# Patient Record
Sex: Male | Born: 1995 | Race: White | Hispanic: No | Marital: Single | Smoking: Never smoker
Health system: Southern US, Community
[De-identification: ages and names within clinical notes are randomized; demographics above are authoritative.]

---
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
## 2017-07-30 ENCOUNTER — Encounter: Payer: Self-pay | Admitting: Family Medicine

## 2017-07-30 ENCOUNTER — Ambulatory Visit (HOSPITAL_BASED_OUTPATIENT_CLINIC_OR_DEPARTMENT_OTHER)
Admission: RE | Admit: 2017-07-30 | Discharge: 2017-07-30 | Disposition: A | Discharge status: Home / Self Care | Payer: 59 | Source: Ambulatory Visit | Attending: Family Medicine | Admitting: Family Medicine

## 2017-07-30 ENCOUNTER — Ambulatory Visit: Payer: 59 | Admitting: Family Medicine

## 2017-07-30 VITALS — BP 120/80 | HR 72 | Temp 98.1°F | Ht 74.0 in | Wt 220.1 lb

## 2017-07-30 DIAGNOSIS — N509 Disorder of male genital organs, unspecified: Secondary | ICD-10-CM | POA: Diagnosis present

## 2017-07-30 DIAGNOSIS — N5089 Other specified disorders of the male genital organs: Secondary | ICD-10-CM

## 2017-07-30 DIAGNOSIS — N503 Cyst of epididymis: Secondary | ICD-10-CM | POA: Insufficient documentation

## 2017-07-30 NOTE — Progress Notes (Signed)
Pre visit review using our clinic review tool, if applicable. No additional management support is needed unless otherwise documented below in the visit note. 

## 2017-07-30 NOTE — Patient Instructions (Signed)
Someone will call you regarding your ultrasound.   Let us know if you need anything.

## 2017-07-30 NOTE — Progress Notes (Signed)
Chief Complaint  Patient presents with  . Testicle Pain    Subjective: Patient is a 22 y.o. male here for testicular mass.  For around 1 yr, pt has noticed a hard lump on his R testicle. No pain, redness, urinary complaints. It is not growing/changing. Denies trauma. No personal/famhx of testicular issues.   ROS: GU: As noted in HPI  Family History  Problem Relation Age of Onset  . Cancer Maternal Aunt [id number]       colon  . Cancer Maternal Uncle 75       colon  . Cancer Maternal Grandmother        breast  . Cancer Paternal Grandmother        uterine   Past Medical History:  Diagnosis Date  . Asthma    No Known Allergies  Takes no meds routinely.  Objective: BP 120/80 (BP Location: Left Arm, Patient Position: Sitting, Cuff Size: Normal)   Pulse 72   Temp 98.1 F (36.7 C) (Oral)   Ht 6\' 2"  (1.88 m)   Wt 220 lb 2 oz (99.8 kg)   SpO2 97%   BMI 28.26 kg/m  General: Awake, appears stated age Lungs: No accessory muscle use GU: R testicle nontender, a small and hard nodule on the body of testicle, it is not freely moveable, no redness, excessive warmth, fluctuance Psych: Age appropriate judgment and insight, normal affect and mood  Assessment and Plan: Testicular mass - Plan: US Scrotum  Orders as above. Ck US. If concerning, will refer to urology and get beta-HCG and AFP.  F/u prn.  The patient voiced understanding and agreement to the plan.  Charles Rocheicholas Paul EldonWendling, DO 07/30/17  7:16 AM

## 2017-08-01 ENCOUNTER — Telehealth: Payer: Self-pay | Admitting: *Deleted

## 2017-08-01 NOTE — Telephone Encounter (Signed)
Copied from CRM [phone]. Topic: Quick Communication - Other Results >> Jul 31, 2017 11:35 AM Crist InfanteHarrald, Kathy J wrote: Pt would like results of ultrasound done yesterday. I also set up on mychart, so ok to send via mychart as well.

## 2017-08-01 NOTE — Telephone Encounter (Signed)
Patient viewed thru mychart. 

## 2017-12-07 ENCOUNTER — Other Ambulatory Visit: Payer: Self-pay

## 2017-12-07 ENCOUNTER — Encounter (HOSPITAL_COMMUNITY): Payer: Self-pay

## 2017-12-07 ENCOUNTER — Emergency Department (HOSPITAL_COMMUNITY)
Admission: EM | Admit: 2017-12-07 | Discharge: 2017-12-07 | Disposition: A | Discharge status: Home / Self Care | Payer: 59 | Attending: Emergency Medicine | Admitting: Emergency Medicine

## 2017-12-07 ENCOUNTER — Emergency Department (HOSPITAL_COMMUNITY): Payer: 59

## 2017-12-07 DIAGNOSIS — J45909 Unspecified asthma, uncomplicated: Secondary | ICD-10-CM | POA: Insufficient documentation

## 2017-12-07 DIAGNOSIS — R002 Palpitations: Secondary | ICD-10-CM

## 2017-12-07 DIAGNOSIS — M545 Low back pain: Secondary | ICD-10-CM | POA: Insufficient documentation

## 2017-12-07 DIAGNOSIS — M79602 Pain in left arm: Secondary | ICD-10-CM | POA: Diagnosis present

## 2017-12-07 LAB — BASIC METABOLIC PANEL
Anion gap: 9 (ref 5–15)
BUN: 13 mg/dL (ref 6–20)
CO2: 27 mmol/L (ref 22–32)
CREATININE: 1.12 mg/dL (ref 0.61–1.24)
Calcium: 9.5 mg/dL (ref 8.9–10.3)
Chloride: 107 mmol/L (ref 101–111)
GFR calc Af Amer: >60 mL/min (ref >60)
Glucose, Bld: 102 mg/dL — ABNORMAL HIGH (ref 65–99)
Potassium: 3.9 mmol/L (ref 3.5–5.1)
SODIUM: 143 mmol/L (ref 135–145)

## 2017-12-07 LAB — I-STAT TROPONIN, ED: Troponin i, poc: 0 ng/mL (ref 0.00–0.08)

## 2017-12-07 LAB — CBC
HCT: 40 % (ref 39.0–52.0)
Hemoglobin: 14.4 g/dL (ref 13.0–17.0)
MCH: 31.4 pg (ref 26.0–34.0)
MCHC: 36 g/dL (ref 30.0–36.0)
MCV: 87.1 fL (ref 78.0–100.0)
Platelets: 384 10*3/uL (ref 150–400)
RBC: 4.59 MIL/uL (ref 4.22–5.81)
RDW: 12.5 % (ref 11.5–15.5)
WBC: 7.6 10*3/uL (ref 4.0–10.5)

## 2017-12-07 LAB — D-DIMER, QUANTITATIVE (NOT AT ARMC)

## 2017-12-07 MED ORDER — IOPAMIDOL (ISOVUE-370) INJECTION 76%
100.0000 mL | Freq: Once | INTRAVENOUS | Status: AC | PRN
  Administered 2017-12-07: 100 mL via INTRAVENOUS

## 2017-12-07 NOTE — ED Provider Notes (Signed)
[facility]-EMERGENCY DEPT Provider Note   CSN: [id number] Arrival date & time: 12/07/17  1918     History   Chief Complaint Chief Complaint  Patient presents with  . Arm Pain  . Palpitations    HPI Charles Zhang is a 22 y.o. male.  Complaining of left arm pain for over a week.  He was donating platelets by apheresis for school and had multiple IV sticks over the course of the fifth through the 12th.  It sounds like over the last 1 they had difficulty accessing when he is still got some bruising over bilateral antecubital.  He states he is also been troubled by this pain pain in his left mid scapular area and a little bit of fluttering in his chest.  He does not feel short of breath though.  His father had a history of a PE due to a gene issue and the family brought him in to be evaluated to make sure he does not have a blood clot.  He denies any fever no chest pain no shortness of breath no abdominal pain nausea vomiting or diarrhea.  His pulse ox here is 100%.  The history is provided by the patient.  Arm Pain  This is a new problem. The current episode started more than 1 week ago. The problem occurs constantly. The problem has not changed since onset.Pertinent negatives include no chest pain, no abdominal pain, no headaches and no shortness of breath. The symptoms are aggravated by bending and twisting. Nothing relieves the symptoms. He has tried nothing for the symptoms. The treatment provided no relief.  Palpitations   This is a new problem. The current episode started 2 days ago. The problem occurs daily. The problem has not changed since onset.Associated symptoms include back pain. Pertinent negatives include no diaphoresis, no fever, no malaise/fatigue, no numbness, no chest pain, no chest pressure, no near-syncope, no orthopnea, no PND, no syncope, no abdominal pain, no nausea, no vomiting, no headaches, no leg pain, no lower extremity edema, no dizziness, no  weakness, no cough and no shortness of breath. He has tried nothing for the symptoms. The treatment provided no relief. There are no known risk factors.    Past Medical History:  Diagnosis Date  . Asthma     Patient Active Problem List   Diagnosis Date Noted  . Asthma 07/25/2011    No past surgical history on file.      Home Medications    Prior to Admission medications   Not on File    Family History Family History  Problem Relation Age of Onset  . Cancer Maternal Aunt [id number]       colon  . Cancer Maternal Uncle 75       colon  . Cancer Maternal Grandmother        breast  . Cancer Paternal Grandmother        uterine    Social History Social History   Tobacco Use  . Smoking status: Never Smoker  . Smokeless tobacco: Never Used  Substance Use Topics  . Alcohol use: No  . Drug use: No     Allergies   Patient has no known allergies.   Review of Systems Review of Systems  Constitutional: Negative for diaphoresis, fever and malaise/fatigue.  HENT: Negative for sore throat.   Eyes: Negative for visual disturbance.  Respiratory: Negative for cough and shortness of breath.   Cardiovascular: Positive for palpitations. Negative for chest pain, orthopnea,  syncope, PND and near-syncope.  Gastrointestinal: Negative for abdominal pain, nausea and vomiting.  Genitourinary: Negative for dysuria.  Musculoskeletal: Positive for back pain. Negative for neck pain.  Skin: Positive for wound (bruising antecub bilat). Negative for rash.  Neurological: Negative for dizziness, weakness, numbness and headaches.     Physical Exam Updated Vital Signs BP (!) 164/100 (BP Location: Right Arm)   Pulse 91   Temp 98.4 F (36.9 C) (Oral)   Resp 16   Ht 6\' 2"  (1.88 m)   Wt 99.8 kg (220 lb)   SpO2 100%   BMI 28.25 kg/m   Physical Exam  Constitutional: He appears well-developed and well-nourished.  HENT:  Head: Normocephalic and atraumatic.  Right Ear: External ear normal.    Left Ear: External ear normal.  Nose: Nose normal.  Mouth/Throat: Oropharynx is clear and moist.  Eyes: Conjunctivae are normal.  Neck: Normal range of motion. Neck supple.  Cardiovascular: Normal rate, regular rhythm, normal heart sounds and intact distal pulses.  No murmur heard. Pulmonary/Chest: Effort normal and breath sounds normal. No respiratory distress.  Abdominal: Soft. There is no tenderness.  Musculoskeletal: Normal range of motion. He exhibits no edema or deformity.  Bilateral antecubital foot some healing bruising.  Neurological: He is alert. He has normal strength. No sensory deficit. GCS eye subscore is 4. GCS verbal subscore is 5. GCS motor subscore is 6.  Skin: Skin is warm and dry.  Psychiatric: He has a normal mood and affect.  Nursing note and vitals reviewed.    ED Treatments / Results  Labs (all labs ordered are listed, but only abnormal results are displayed) Labs Reviewed  BASIC METABOLIC PANEL - Abnormal; Notable for the following components:      Result Value   Glucose, Bld 102 (*)    All other components within normal limits  CBC  D-DIMER, QUANTITATIVE (NOT AT [facility])  I-STAT TROPONIN, ED    EKG EKG Interpretation  Date/Time:  Friday December 07 2017 20:01:17 EDT Ventricular Rate:  88 PR Interval:    QRS Duration: 101 QT Interval:  344 QTC Calculation: 417 R Axis:   99 Text Interpretation:  Sinus rhythm Borderline right axis deviation Borderline T wave abnormalities no prior to compare with Confirmed by Meridee Score [phone]) on 12/07/2017 10:24:09 PM   Radiology Dg Chest 2 View  Result Date: 12/07/2017 CLINICAL DATA:  Left arm pain for 1 week.  Heart fluttering. EXAM: CHEST - 2 VIEW COMPARISON:  None. FINDINGS: Lungs clear. Heart size normal. No pneumothorax or pleural fluid. No bony abnormality. IMPRESSION: Normal chest. Electronically Signed   By: Drusilla Kanner M.D.   On: 12/07/2017 20:45   Ct Angio Chest Pe W/cm &/or Wo Cm  Result  Date: 12/07/2017 CLINICAL DATA:  LEFT arm pain, fluttering in chest. Recent platelet donation. Family history of pulmonary embolism. EXAM: CT ANGIOGRAPHY CHEST WITH CONTRAST TECHNIQUE: Multidetector CT imaging of the chest was performed using the standard protocol during bolus administration of intravenous contrast. Multiplanar CT image reconstructions and MIPs were obtained to evaluate the vascular anatomy. CONTRAST:  ISOVUE-370 IOPAMIDOL (ISOVUE-370) INJECTION 76% COMPARISON:  Chest radiograph December 07, 2017 FINDINGS: Mild respiratory motion degraded examination. CARDIOVASCULAR: Suboptimal contrast opacification of the pulmonary artery's (175 counseled units, target is 250 Hounsfield units. Main pulmonary artery is not enlarged. No pulmonary arterial filling defects to the level of the segmental branches. Heart size is normal, no right heart strain. No pericardial effusion. Thoracic aorta is normal course and caliber, unremarkable.  MEDIASTINUM/NODES: No lymphadenopathy by CT size criteria. LUNGS/PLEURA: Tracheobronchial tree is patent, no pneumothorax. No pleural effusions, focal consolidations, pulmonary nodules or masses. UPPER ABDOMEN: Included view of the abdomen is unremarkable. MUSCULOSKELETAL: Nonacute.  Schmorl's nodes. Review of the MIP images confirms the above findings. IMPRESSION: Negative CTA chest. Electronically Signed   By: Awilda Metro M.D.   On: 12/07/2017 23:29    Procedures Procedures (including critical care time)  Medications Ordered in ED Medications - No data to display   Initial Impression / Assessment and Plan / ED Course  I have reviewed the triage vital signs and the nursing notes.  Pertinent labs & imaging results that were available during my care of the patient were reviewed by me and considered in my medical decision making (see chart for details).  Clinical Course as of Dec 09 1035  Fri Dec 07, 2017  2321 After long discussion of the pros and cons of  d-dimer versus CAT scan the family and the patient decided that they would do the d-dimer and then CAT scan if needed.  About 20 minutes later the nurse came back to me and so the family would just want the CAT scan only because they would be satisfied with a d-dimer if it was negative.  CT was ordered.   [MB]    Clinical Course User Index [MB] Terrilee Files, MD     Final Clinical Impressions(s) / ED Diagnoses   Final diagnoses:  Heart palpitations  Arm pain, anterior, left    ED Discharge Orders    None       Terrilee Files, MD 12/08/17 1038

## 2017-12-07 NOTE — ED Triage Notes (Addendum)
Pt arrives c/o left arm pain x 1week. Pt donated platelets x1 week ago (donated single unit on 5th, 7th, 10th, 12th) Pt reports arm pain while moving, bruise noted to left AC area. Pt also reports faint pain in mid/upper left side of back as well as mild chest discomfort, states that "it feels like my heart is fluttering." Denies shortness of breath. Pt has full ROM of arm

## 2017-12-07 NOTE — Discharge Instructions (Addendum)
Your evaluated in the emergency department for some heart palpitations and arm pain in the setting of having recently done platelet donations.  There was concern for having a pulmonary embolism with your family history.  A CAT scan was done to evaluate this and showed no evidence of a blood clot.  You also had an EKG that was done that showed some abnormal T waves with no old one to compare with.  This will need follow-up with your primary care doctor.

## 2018-09-28 IMAGING — US US SCROTUM W/ DOPPLER COMPLETE
1 series · 14 of 25 positions shown · non-contrast
Comparison: None.

CLINICAL DATA: Palpable right testicular mass

EXAM:
SCROTAL ULTRASOUND
DOPPLER ULTRASOUND OF THE TESTICLES
TECHNIQUE: Complete ultrasound examination of the testicles, epididymis, and
other scrotal structures was performed. Color and spectral Doppler
ultrasound were also utilized to evaluate blood flow to the
testicles.

[Series 1: us scrotum w/ doppler complete · 0.06mm/px · 14 of 65 slices shown]
[im 1/65]
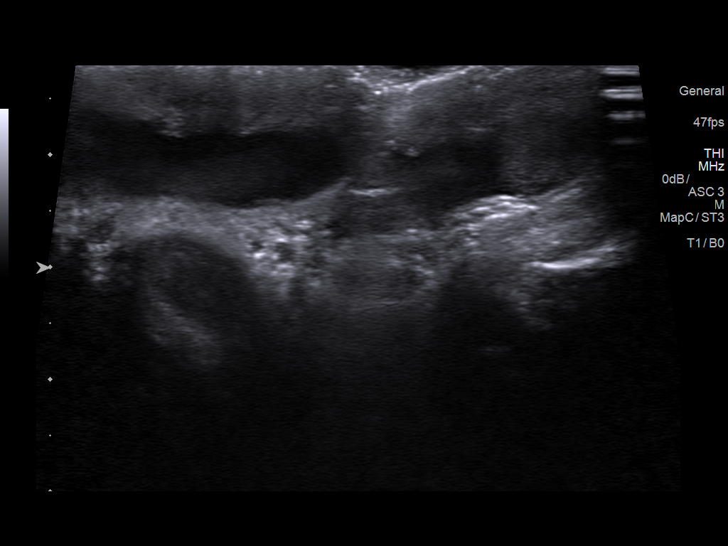
[im 6/65]
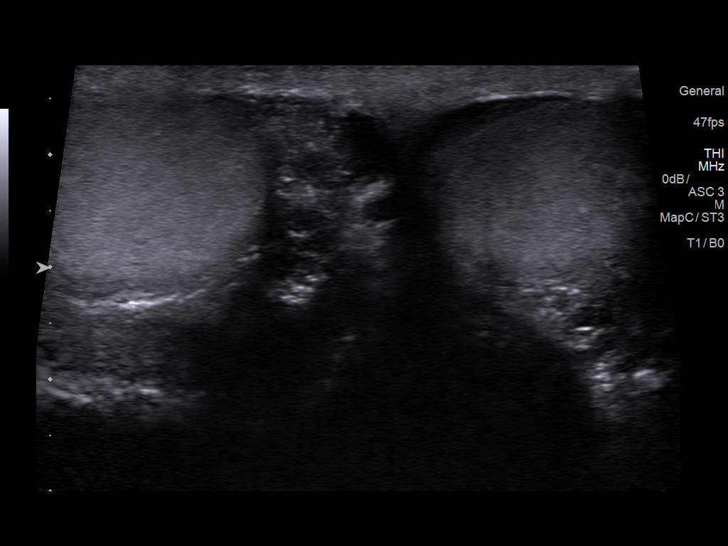
[im 11/65]
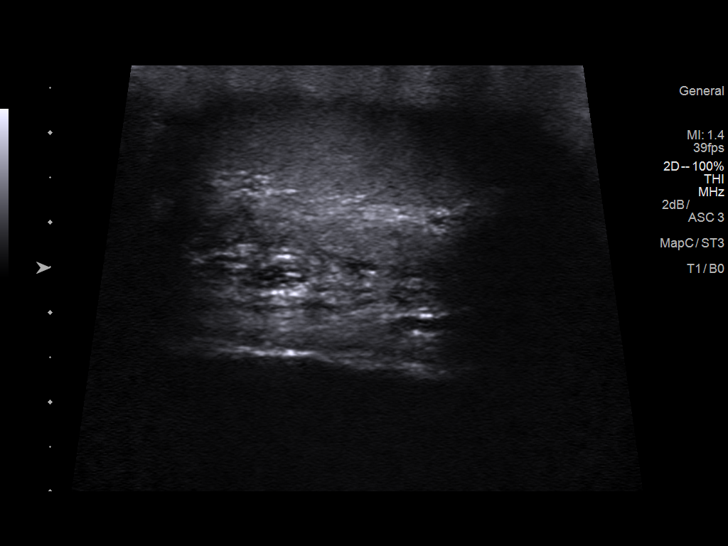
[im 17/65]
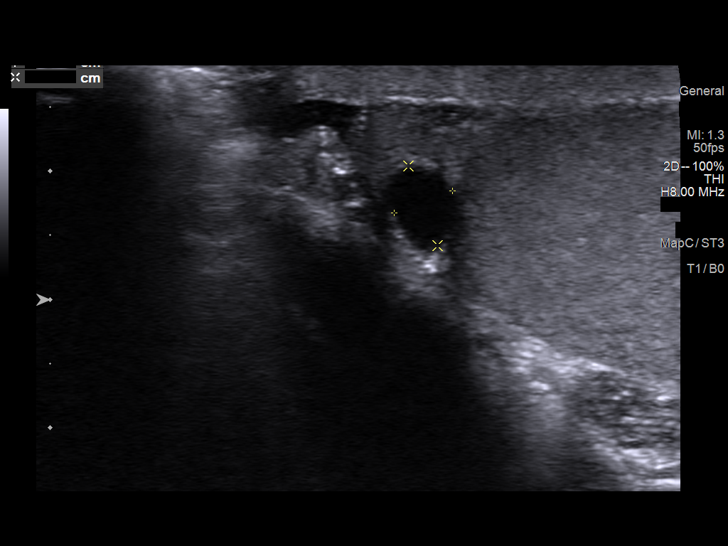
[im 22/65]
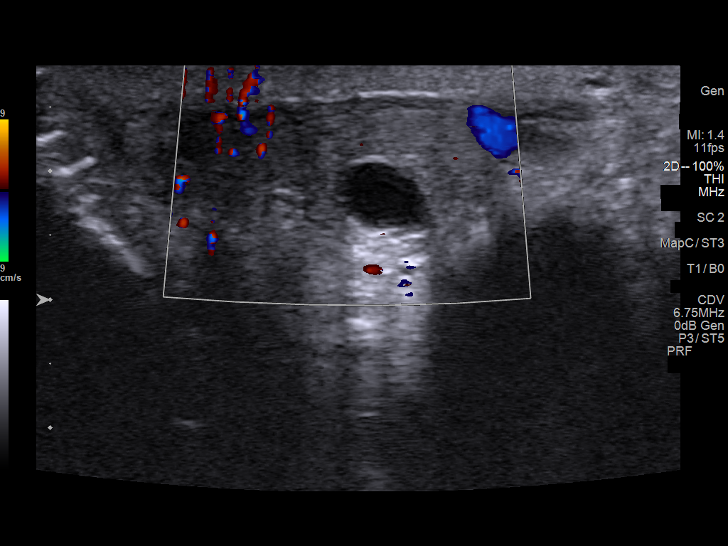
[im 25/65]
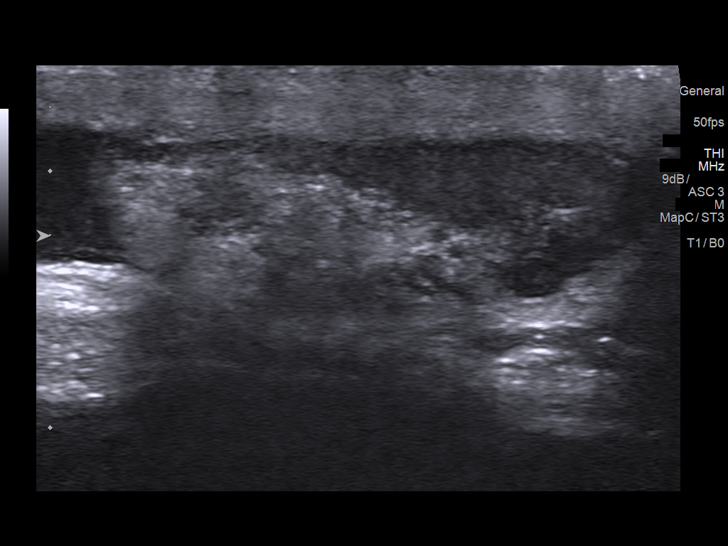
[im 30/65]
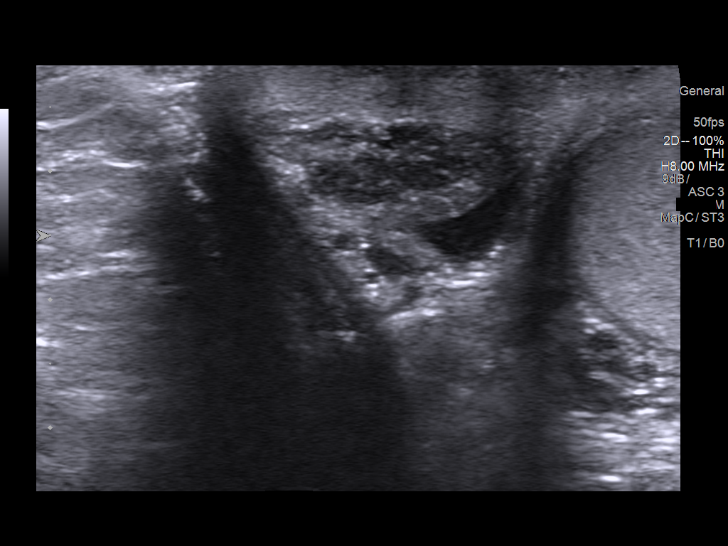
[im 35/65]
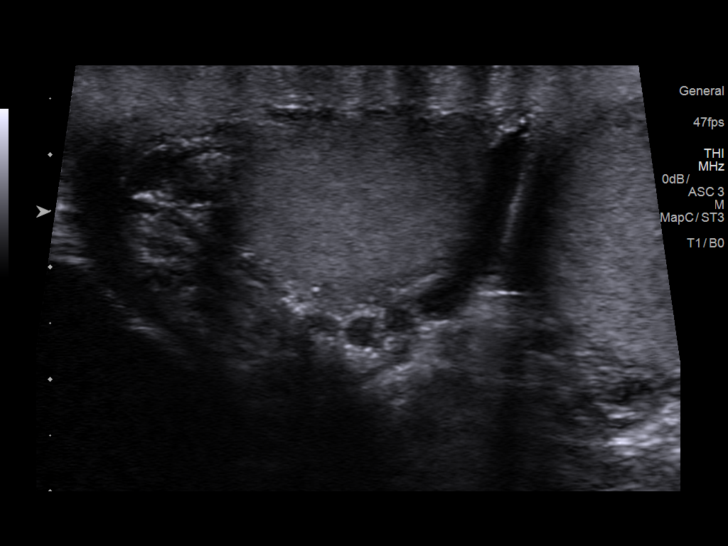
[im 41/65]
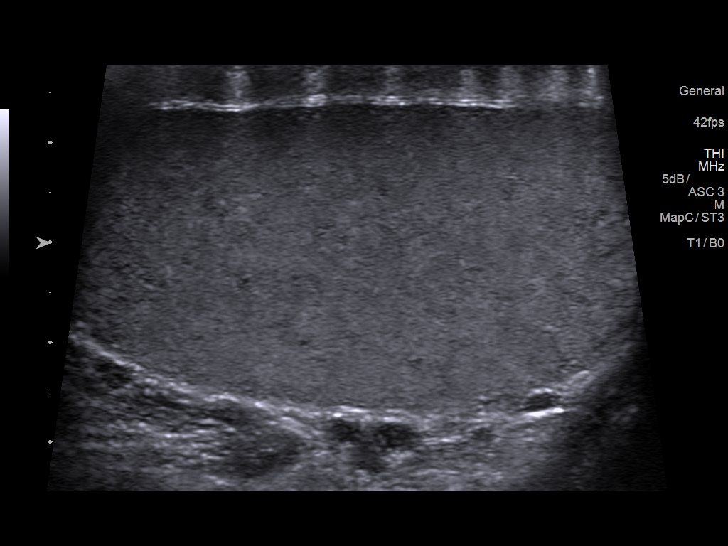
[im 43/65]
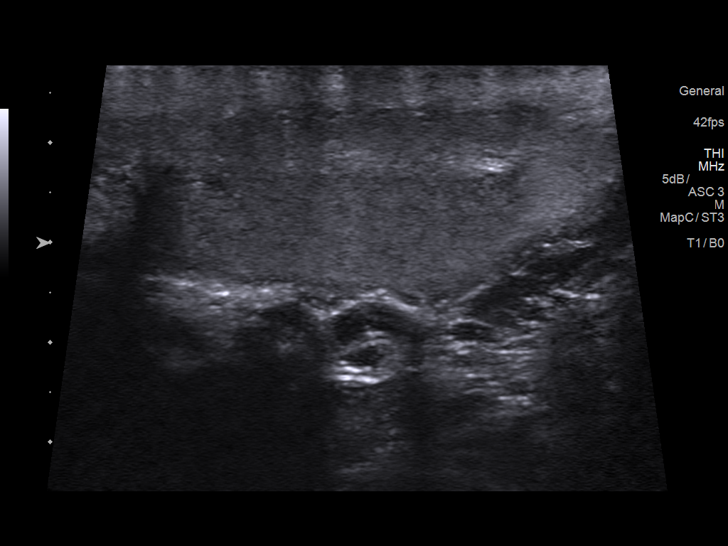
[im 49/65]
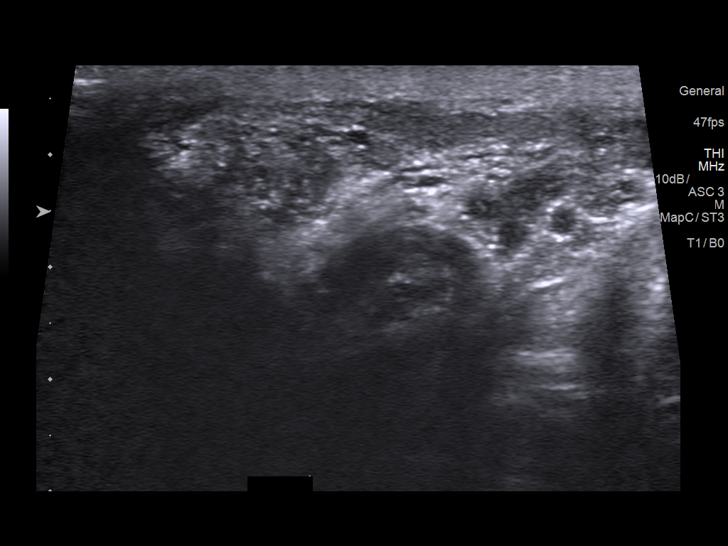
[im 54/65]
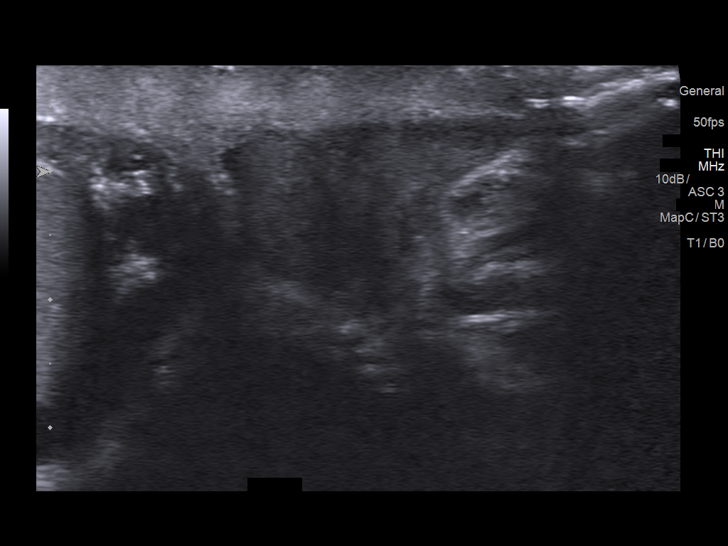
[im 59/65]
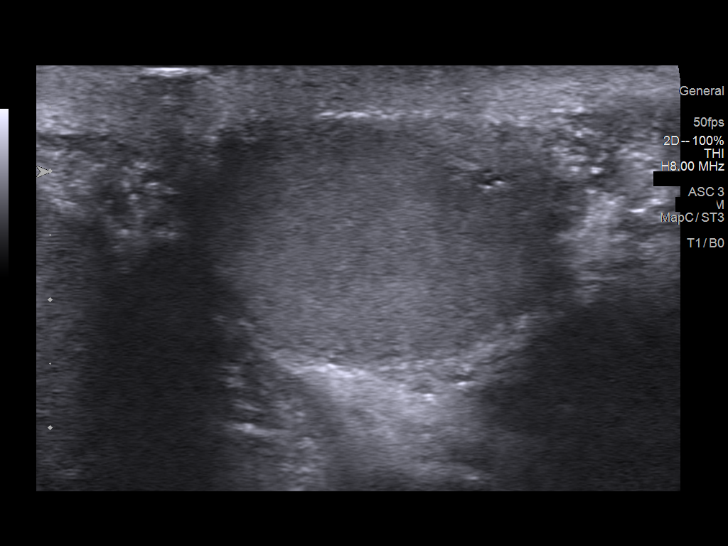
[im 65/65]
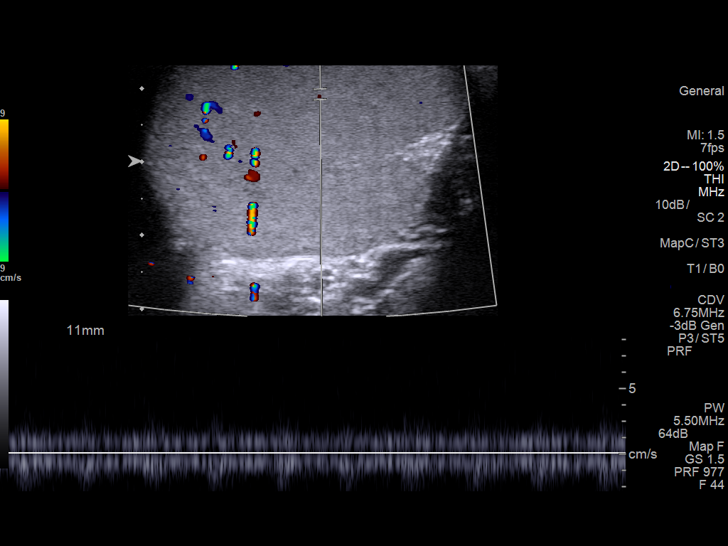

[14 of 25 positions shown; findings below may reference images not displayed]

FINDINGS: Right testicle

Measurements: 5.9 x 2.9 x 3.6 cm. No mass or microlithiasis
visualized.

Left testicle

Measurements: 5.4 x 2.9 x 4.1 cm. No mass or microlithiasis
visualized.

Right epididymis:  5 x 7 x 6 mm right epididymal head cyst.

Left epididymis:  Normal in size and appearance.

Hydrocele:  Physiologic fluid bilaterally.

Varicocele:  None visualized.

Pulsed Doppler interrogation of both testes demonstrates normal low
resistance arterial and venous waveforms bilaterally.
IMPRESSION: 7 mm right epididymal head cysts, likely corresponding to the
palpable abnormality.

Normal sonographic appearance of the bilateral testes.

No evidence of testicular torsion.

## 2018-12-09 ENCOUNTER — Other Ambulatory Visit: Payer: Self-pay

## 2018-12-09 ENCOUNTER — Encounter: Payer: Self-pay | Admitting: Family Medicine

## 2018-12-09 ENCOUNTER — Ambulatory Visit (INDEPENDENT_AMBULATORY_CARE_PROVIDER_SITE_OTHER): Payer: 59 | Admitting: Family Medicine

## 2018-12-09 ENCOUNTER — Ambulatory Visit: Payer: Self-pay | Admitting: *Deleted

## 2018-12-09 DIAGNOSIS — M940 Chondrocostal junction syndrome [Tietze]: Secondary | ICD-10-CM

## 2018-12-09 NOTE — Telephone Encounter (Signed)
Virtual visit scheduled.  

## 2018-12-09 NOTE — Progress Notes (Signed)
Virtual Visit via Video Note  I connected with Charles Zhang on 12/09/18 at  9:30 AM EDT by a video enabled telemedicine application and verified that I am speaking with the correct person using two identifiers.  Location: Patient: home Provider: home    I discussed the limitations of evaluation and management by telemedicine and the availability of in person appointments. The patient expressed understanding and agreed to proceed.  History of Present Illness: Pt c/o r side pain ---  No sob ,  It started after he started running   He stopped running Thursday.  Today is the best he has been   IB helps  It seems to be improving since he stopped running  No nvd, no fever  No chest pain or sob No wheezing       Observations/Objective: No vitals obtained Pt in NAD   Assessment  1. Costochondritis co'nt ibuprofen , heat , warm compresses Will bring into office if no releif  Rest-- no running for now -- can walk                        and Plan:   Follow Up Instructions:    I discussed the assessment and treatment plan with the patient. The patient was provided an opportunity to ask questions and all were answered. The patient agreed with the plan and demonstrated an understanding of the instructions.   The patient was advised to call back or seek an in-person evaluation if the symptoms worsen or if the condition fails to improve as anticipated.  I provided 15 minutes of non-face-to-face time during this encounter.   Ann Held, DO

## 2018-12-09 NOTE — Telephone Encounter (Signed)
Pt called with complaints stating that when he started running on 12/02/2018, and 12/04/2018 subsequently he has a stabbing pain on his right side which is worse when he takes a deep breath; when he lays down; the pt rates his pain at 7 out of 10 when laying down, and 1-2 out of 10 when standing or sitting;  he has taken Advil which has helped; the pt says that he has a slight cough; he says that the pain has not affected his lung capacity; recommendations made per nurse triage protocol; he verbalized understanding; he normally sees Dr Etter Sjogren, [place]; pt transferred to [facility] for scheduling.  Reason for Disposition . MODERATE pain (e.g., interferes with normal activities or awakens from sleep)  Answer Assessment - Initial Assessment Questions 1. LOCATION: "Where does it hurt?" (e.g., left, right)     right 2. ONSET: "When did the pain start?"    12/04/2018 3. SEVERITY: "How bad is the pain?" (e.g., Scale 1-10; mild, moderate, or severe)   - MILD (1-3): doesn't interfere with normal activities    - MODERATE (4-7): interferes with normal activities or awakens from sleep    - SEVERE (8-10): excruciating pain and patient unable to do normal activities (stays in bed)       7 out of 10 4. PATTERN: "Does the pain come and go, or is it constant?"      constant 5. CAUSE: "What do you think is causing the pain?"     Not sure 6. OTHER SYMPTOMS:  "Do you have any other symptoms?" (e.g., fever, abdominal pain, vomiting, leg weakness, burning with urination, blood in urine)    no 7. PREGNANCY:  "Is there any chance you are pregnant?" "When was your last menstrual period?"     n/a  Protocols used: FLANK PAIN-A-AH

## 2019-04-22 IMAGING — CR DG CHEST 2V
2 series · 2 of 2 positions shown · non-contrast
Comparison: None.

CLINICAL DATA: Left arm pain for 1 week.  Heart fluttering.

EXAM:
CHEST - 2 VIEW

[w chest pa]
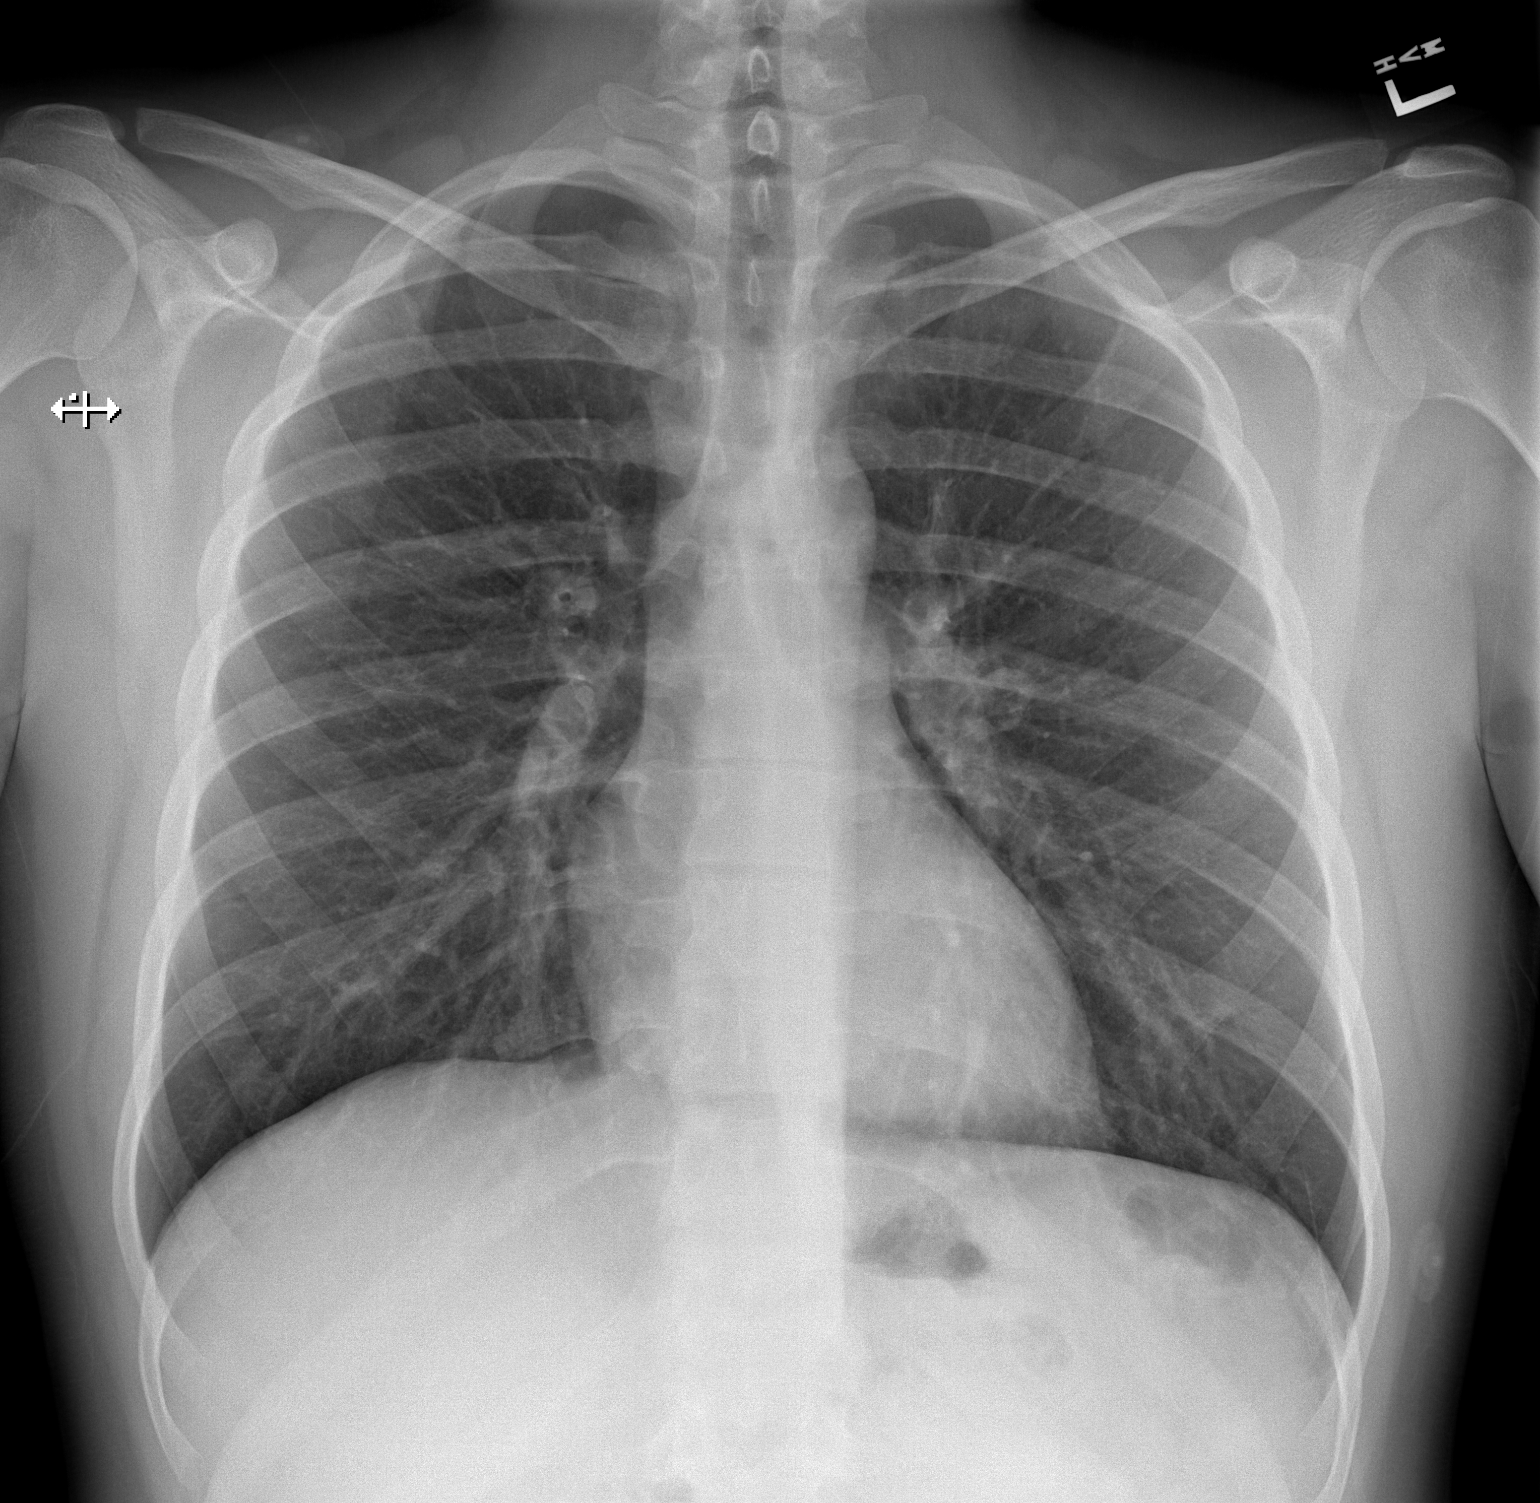

[w chest lat]
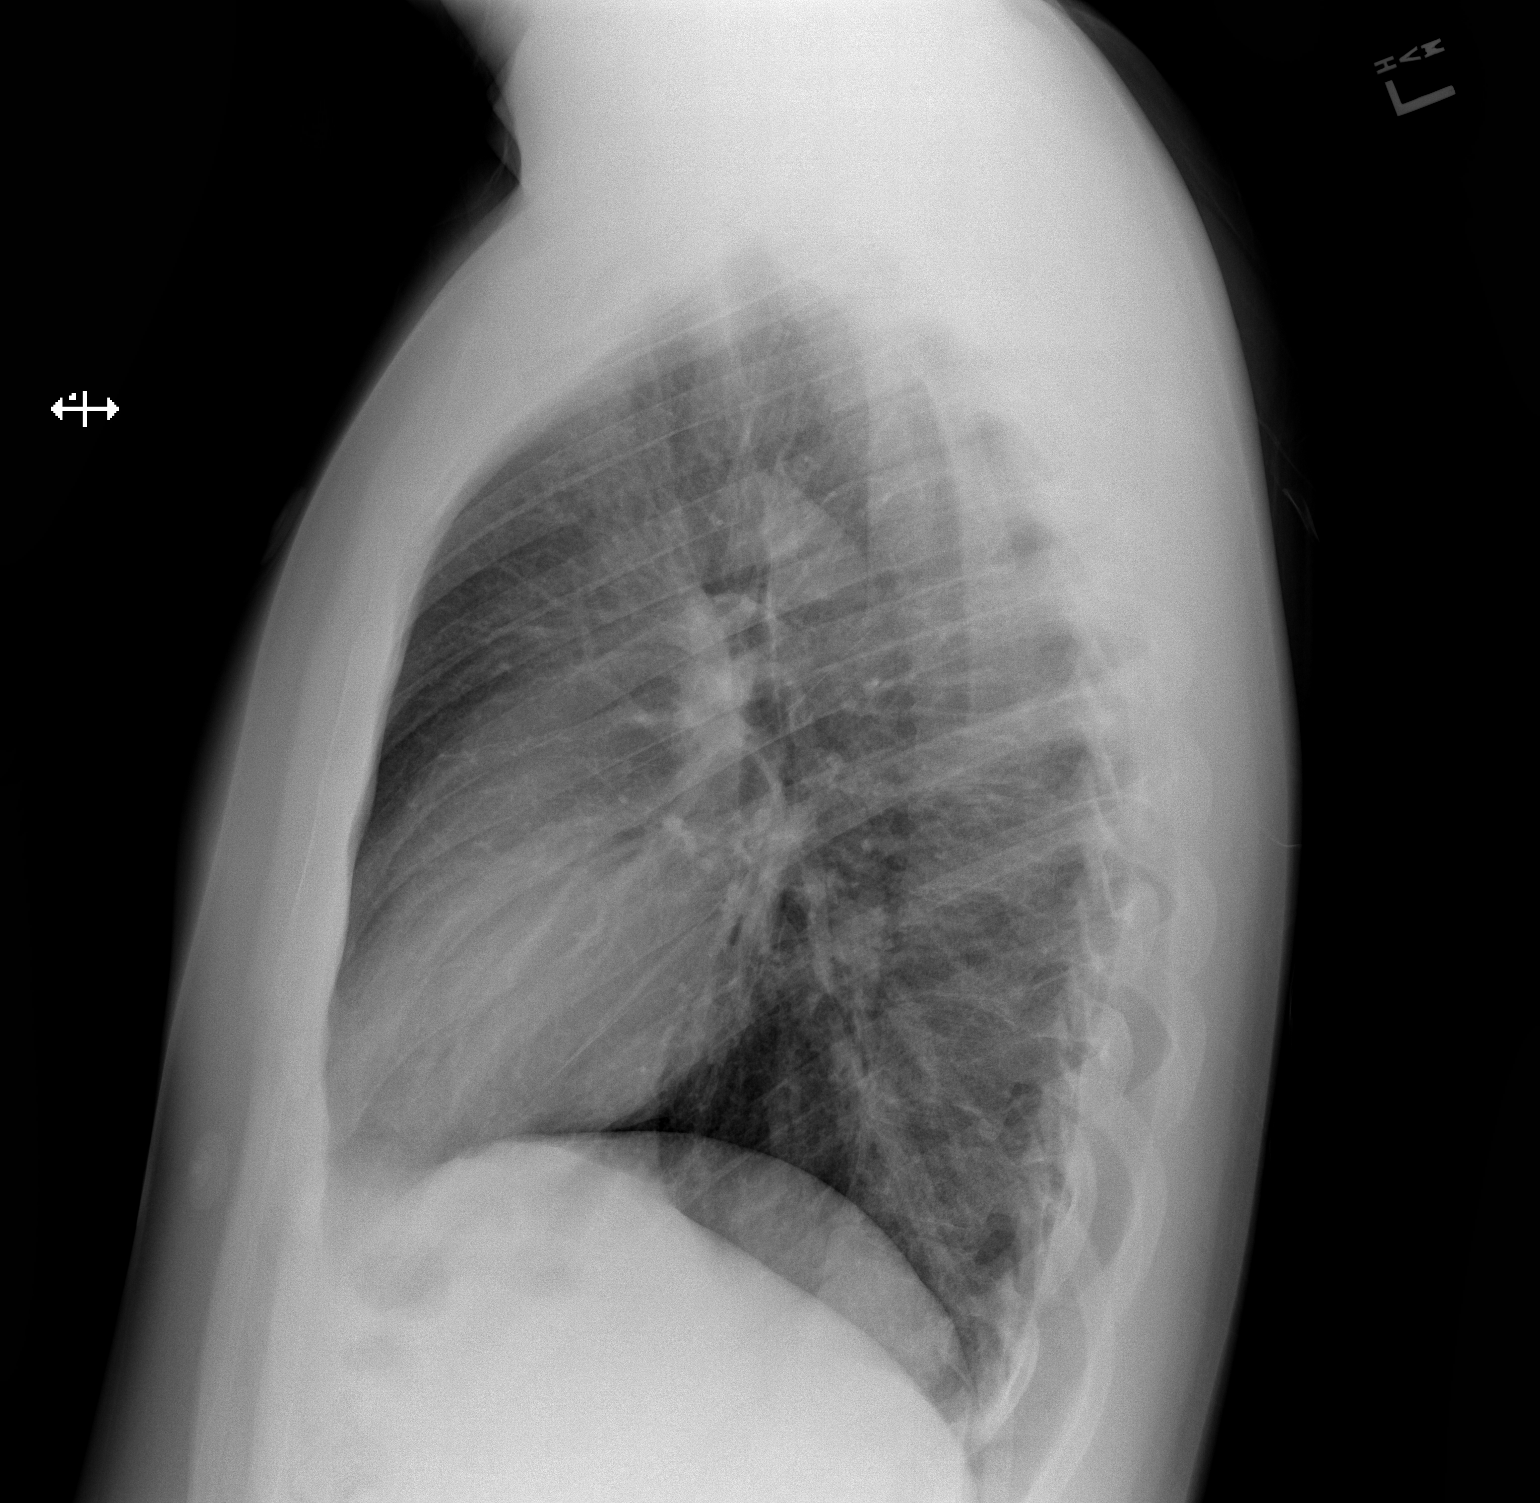

[2 of 2 positions shown; findings below may reference images not displayed]

FINDINGS: Lungs clear. Heart size normal. No pneumothorax or pleural fluid. No
bony abnormality.
IMPRESSION: Normal chest.

## 2020-02-14 ENCOUNTER — Encounter (HOSPITAL_BASED_OUTPATIENT_CLINIC_OR_DEPARTMENT_OTHER): Payer: Self-pay | Admitting: Emergency Medicine

## 2020-02-14 ENCOUNTER — Other Ambulatory Visit: Payer: Self-pay

## 2020-02-14 ENCOUNTER — Emergency Department (HOSPITAL_BASED_OUTPATIENT_CLINIC_OR_DEPARTMENT_OTHER)
Admission: EM | Admit: 2020-02-14 | Discharge: 2020-02-14 | Disposition: A | Discharge status: Home / Self Care | Payer: 59 | Attending: Emergency Medicine | Admitting: Emergency Medicine

## 2020-02-14 DIAGNOSIS — S86911A Strain of unspecified muscle(s) and tendon(s) at lower leg level, right leg, initial encounter: Secondary | ICD-10-CM | POA: Insufficient documentation

## 2020-02-14 DIAGNOSIS — W2102XA Struck by soccer ball, initial encounter: Secondary | ICD-10-CM | POA: Insufficient documentation

## 2020-02-14 DIAGNOSIS — Y9366 Activity, soccer: Secondary | ICD-10-CM | POA: Insufficient documentation

## 2020-02-14 DIAGNOSIS — Y92322 Soccer field as the place of occurrence of the external cause: Secondary | ICD-10-CM | POA: Insufficient documentation

## 2020-02-14 DIAGNOSIS — Y999 Unspecified external cause status: Secondary | ICD-10-CM | POA: Diagnosis not present

## 2020-02-14 DIAGNOSIS — Z5321 Procedure and treatment not carried out due to patient leaving prior to being seen by health care provider: Secondary | ICD-10-CM | POA: Insufficient documentation

## 2020-02-14 NOTE — ED Triage Notes (Signed)
Patient states that he was playing soccer and felt his right calf muscle snap. The patient walks to triage with a limp at this time.

## 2020-02-18 ENCOUNTER — Telehealth: Payer: Self-pay

## 2020-02-18 NOTE — Telephone Encounter (Signed)
Left message on [phone] after calling [phone] and reaching pt's father, Charles Zhang.  Charles Zhang told me to call the pt on the other number.  Left msg asking pt to call office back to advise which number to use tomorrow for a VOV instead of an in person visit with his PCP at 11 am.

## 2020-02-19 ENCOUNTER — Other Ambulatory Visit: Payer: Self-pay

## 2020-02-19 ENCOUNTER — Telehealth (INDEPENDENT_AMBULATORY_CARE_PROVIDER_SITE_OTHER): Payer: 59 | Admitting: Family Medicine

## 2020-02-19 DIAGNOSIS — J208 Acute bronchitis due to other specified organisms: Secondary | ICD-10-CM

## 2020-02-19 DIAGNOSIS — U071 COVID-19: Secondary | ICD-10-CM | POA: Diagnosis not present

## 2020-02-19 MED ORDER — AZITHROMYCIN 250 MG PO TABS: ORAL_TABLET | ORAL | 0 refills | Status: DC

## 2020-02-19 MED ORDER — PREDNISONE 10 MG PO TABS: ORAL_TABLET | ORAL | 0 refills | Status: DC

## 2020-02-19 NOTE — Progress Notes (Signed)
Virtual Visit via Video Note  I connected with Charles Zhang on 02/19/20 at 11:00 AM EDT by a video enabled telemedicine application and verified that I am speaking with the correct person using two identifiers.  Location: Patient: home alone  Provider: office    I discussed the limitations of evaluation and management by telemedicine and the availability of in person appointments. The patient expressed understanding and agreed to proceed. Past Medical History:  Diagnosis Date  . Asthma    No current outpatient medications on file prior to visit.   No current facility-administered medications on file prior to visit.   History of Present Illness: Pt tested positive about 1 month ago in [place] at the pharmacy.   He still has wheezing and coughing-- dry cough No fever, no chills   Observations/Objective: There were no vitals filed for this visit. Pt is in nad   Assessment and Plan: 1. Acute bronchitis due to COVID-19 virus abx and pred taper Check cxr Consider post covid clinic - azithromycin (ZITHROMAX Z-PAK) 250 MG tablet; As directed  Dispense: 6 each; Refill: 0 - predniSONE (DELTASONE) 10 MG tablet; TAKE 3 TABLETS PO QD FOR 3 DAYS THEN TAKE 2 TABLETS PO QD FOR 3 DAYS THEN TAKE 1 TABLET PO QD FOR 3 DAYS THEN TAKE 1/2 TAB PO QD FOR 3 DAYS  Dispense: 20 tablet; Refill: 0 - DG Chest 2 View; Future   Follow Up Instructions:    I discussed the assessment and treatment plan with the patient. The patient was provided an opportunity to ask questions and all were answered. The patient agreed with the plan and demonstrated an understanding of the instructions.   The patient was advised to call back or seek an in-person evaluation if the symptoms worsen or if the condition fails to improve as anticipated.  I provided 25 minutes of non-face-to-face time during this encounter.   Donato Schultz, DO

## 2020-02-22 ENCOUNTER — Encounter: Payer: Self-pay | Admitting: Family Medicine

## 2021-09-12 ENCOUNTER — Encounter: Payer: Self-pay | Admitting: Family Medicine

## 2021-09-12 ENCOUNTER — Ambulatory Visit: Payer: 59 | Admitting: Family Medicine

## 2021-09-12 VITALS — BP 134/82 | HR 75 | Temp 98.1°F | Resp 16 | Ht 74.0 in | Wt 204.4 lb

## 2021-09-12 DIAGNOSIS — F418 Other specified anxiety disorders: Secondary | ICD-10-CM

## 2021-09-12 LAB — CBC WITH DIFFERENTIAL/PLATELET
Basophils Absolute: 0 10*3/uL (ref 0.0–0.1)
Basophils Relative: 0.5 % (ref 0.0–3.0)
Eosinophils Absolute: 0.2 10*3/uL (ref 0.0–0.7)
Eosinophils Relative: 2.8 % (ref 0.0–5.0)
HCT: 42 % (ref 39.0–52.0)
Hemoglobin: 14.5 g/dL (ref 13.0–17.0)
Lymphocytes Relative: 23.5 % (ref 12.0–46.0)
Lymphs Abs: 1.8 10*3/uL (ref 0.7–4.0)
MCHC: 34.4 g/dL (ref 30.0–36.0)
MCV: 90.4 fl (ref 78.0–100.0)
Monocytes Absolute: 0.5 10*3/uL (ref 0.1–1.0)
Monocytes Relative: 6.8 % (ref 3.0–12.0)
Neutro Abs: 5 10*3/uL (ref 1.4–7.7)
Neutrophils Relative %: 66.4 % (ref 43.0–77.0)
Platelets: 304 10*3/uL (ref 150.0–400.0)
RBC: 4.65 Mil/uL (ref 4.22–5.81)
RDW: 12.4 % (ref 11.5–15.5)
WBC: 7.6 10*3/uL (ref 4.0–10.5)

## 2021-09-12 LAB — COMPREHENSIVE METABOLIC PANEL
ALT: 19 U/L (ref 0–53)
AST: 26 U/L (ref 0–37)
Albumin: 4.8 g/dL (ref 3.5–5.2)
Alkaline Phosphatase: 59 U/L (ref 39–117)
BUN: 15 mg/dL (ref 6–23)
CO2: 27 mEq/L (ref 19–32)
Calcium: 9.4 mg/dL (ref 8.4–10.5)
Chloride: 104 mEq/L (ref 96–112)
Creatinine, Ser: 0.92 mg/dL (ref 0.40–1.50)
GFR: 116.4 mL/min (ref >60.00)
Glucose, Bld: 93 mg/dL (ref 70–99)
Potassium: 4.2 mEq/L (ref 3.5–5.1)
Sodium: 140 mEq/L (ref 135–145)
Total Bilirubin: 1 mg/dL (ref 0.2–1.2)
Total Protein: 7.3 g/dL (ref 6.0–8.3)

## 2021-09-12 MED ORDER — ESCITALOPRAM OXALATE 10 MG PO TABS: 10.0000 mg | ORAL_TABLET | Freq: Every day | ORAL | 2 refills | Status: DC

## 2021-09-12 NOTE — Assessment & Plan Note (Addendum)
Start lexapro ?F/u 1 month ?Recommended counseling but pt wants to hold off ?

## 2021-09-12 NOTE — Patient Instructions (Signed)

## 2021-09-12 NOTE — Progress Notes (Addendum)
? ? ? ? ? ? ? ? ? ? ? ? ? ? ? ? ? ? ? ? ? ? ? ? ? ?Subjective:  ? ?By signing my name below, I, Charles Zhang, attest that this documentation has been prepared under the direction and in the presence of Seabron Spates DO, 09/12/2021 ?   ? ? Patient ID: Charles Zhang, male    DOB: June 11, 1996, 26 y.o.   MRN: [id number] ? ?Chief Complaint  ?Patient presents with  ? Anxiety  ? Depression  ? ? ?HPI ?Patient is in today for an office visit.  ? ?His anxiety and depression has been ongoing for awhile. He states that he has had anxiety longer than his depression. He states that his anxiety symptoms are more present than depression. He feels anxious on most days. He states that he worries a lot and has trouble relaxing. He denies having trouble staying still.He states that he has had one anxiety attack that happened a few years ago. At times he feels that his heart is racing. He denies having trouble focusing and concentrating. ? ?He feels depressed about half of the day. He states that he often feels bad about himself. His mother takes Prozac for depression symptoms. He does not currently see have a therapist.  ? ?He is having trouble falling asleep at night. He states that once he falls asleep he can have a restful sleep. When he wakes up he notices that he clinches his fist and is grinding his teeth. ? ?He typically eats well. Occasionally, he won't eat well on days and then binge the following day.  ? ? ? ?Past Medical History:  ?Diagnosis Date  ? Asthma   ? ? ?No past surgical history on file. ? ?Family History  ?Problem Relation Age of Onset  ? Cancer Maternal Aunt 57  ?     colon  ? Cancer Maternal Uncle 45  ?     colon  ? Cancer Maternal Grandmother   ?     breast  ? Cancer Paternal Grandmother   ?     uterine  ? ? ?Social History  ? ?Socioeconomic History  ? Marital status: Single  ?  Spouse name: Not on file  ? Number of children: Not on file  ? Years of education: Not on file  ? Highest education level: Not on  file  ?Occupational History  ? Occupation: student  ?Tobacco Use  ? Smoking status: Never  ? Smokeless tobacco: Never  ?Substance and Sexual Activity  ? Alcohol use: No  ? Drug use: No  ? Sexual activity: Not on file  ?Other Topics Concern  ? Not on file  ?Social History Narrative  ? Not on file  ? ?Social Determinants of Health  ? ?Financial Resource Strain: Not on file  ?Food Insecurity: Not on file  ?Transportation Needs: Not on file  ?Physical Activity: Not on file  ?Stress: Not on file  ?Social Connections: Not on file  ?Intimate Partner Violence: Not on file  ? ? ?Outpatient Medications Prior to Visit  ?Medication Sig Dispense Refill  ? azithromycin (ZITHROMAX Z-PAK) 250 MG tablet As directed (Patient not taking: Reported on 09/12/2021) 6 each 0  ? predniSONE (DELTASONE) 10 MG tablet TAKE 3 TABLETS PO QD FOR 3 DAYS THEN TAKE 2 TABLETS PO QD FOR 3 DAYS THEN TAKE 1 TABLET PO QD FOR 3 DAYS THEN TAKE 1/2 TAB PO QD FOR 3 DAYS (Patient not taking: Reported on 09/12/2021) 20  tablet 0  ? ?No facility-administered medications prior to visit.  ? ? ?No Known Allergies ? ?Review of Systems  ?Constitutional:  Positive for malaise/fatigue. Negative for fever.  ?HENT:  Negative for congestion.   ?Eyes:  Negative for blurred vision.  ?Respiratory:  Negative for cough and shortness of breath.   ?Cardiovascular:  Negative for chest pain, palpitations and leg swelling.  ?Gastrointestinal:  Negative for vomiting.  ?Musculoskeletal:  Negative for back pain.  ?Skin:  Negative for rash.  ?Neurological:  Negative for loss of consciousness and headaches.  ?Psychiatric/Behavioral:  Positive for depression. The patient is nervous/anxious.   ? ?   ?Objective:  ?  ?Physical Exam ?Vitals and nursing note reviewed.  ?Constitutional:   ?   General: He is not in acute distress. ?   Appearance: Normal appearance. He is not ill-appearing.  ?HENT:  ?   Head: Normocephalic and atraumatic.  ?   Right Ear: External ear normal.  ?   Left Ear: External  ear normal.  ?Eyes:  ?   Extraocular Movements: Extraocular movements intact.  ?   Pupils: Pupils are equal, round, and reactive to light.  ?Cardiovascular:  ?   Rate and Rhythm: Normal rate and regular rhythm.  ?   Heart sounds: Normal heart sounds. No murmur heard. ?  No gallop.  ?Pulmonary:  ?   Effort: Pulmonary effort is normal. No respiratory distress.  ?   Breath sounds: Normal breath sounds. No wheezing or rales.  ?Skin: ?   General: Skin is warm and dry.  ?Neurological:  ?   Mental Status: He is alert and oriented to person, place, and time.  ?Psychiatric:     ?   Mood and Affect: Mood normal.     ?   Behavior: Behavior normal.     ?   Thought Content: Thought content normal.     ?   Judgment: Judgment normal.  ? ? ?BP 134/82 (BP Location: Left Arm, Patient Position: Sitting, Cuff Size: Normal)   Pulse 75   Temp 98.1 ?F (36.7 ?C) (Oral)   Resp 16   Ht 6\' 2"  (1.88 m)   Wt 204 lb 6.4 oz (92.7 kg)   SpO2 99%   BMI 26.24 kg/m?  ?Wt Readings from Last 3 Encounters:  ?09/12/21 204 lb 6.4 oz (92.7 kg)  ?02/14/20 235 lb (106.6 kg)  ?12/07/17 220 lb (99.8 kg)  ? ? ?Diabetic Foot Exam - Simple   ?No data filed ?  ? ?Lab Results  ?Component Value Date  ? WBC 7.6 12/07/2017  ? HGB 14.4 12/07/2017  ? HCT 40.0 12/07/2017  ? PLT 384 12/07/2017  ? GLUCOSE 102 (H) 12/07/2017  ? NA 143 12/07/2017  ? K 3.9 12/07/2017  ? CL 107 12/07/2017  ? CREATININE 1.12 12/07/2017  ? BUN 13 12/07/2017  ? CO2 27 12/07/2017  ? ? ?No results found for: TSH ?Lab Results  ?Component Value Date  ? WBC 7.6 12/07/2017  ? HGB 14.4 12/07/2017  ? HCT 40.0 12/07/2017  ? MCV 87.1 12/07/2017  ? PLT 384 12/07/2017  ? ?Lab Results  ?Component Value Date  ? NA 143 12/07/2017  ? K 3.9 12/07/2017  ? CO2 27 12/07/2017  ? GLUCOSE 102 (H) 12/07/2017  ? BUN 13 12/07/2017  ? CREATININE 1.12 12/07/2017  ? CALCIUM 9.5 12/07/2017  ? ANIONGAP 9 12/07/2017  ? ?No results found for: CHOL ?No results found for: HDL ?No results found for: LDLCALC ?No results found  for: TRIG ?No results found for: CHOLHDL ?No results found for: HGBA1C ? ?   ?Assessment & Plan:  ? ?Problem List Items Addressed This Visit   ? ?  ? Unprioritized  ? Depression with anxiety - Primary  ?  Start lexapro ?F/u 1 month ?Recommended counseling but pt wants to hold off ?  ?  ? Relevant Medications  ? escitalopram (LEXAPRO) 10 MG tablet  ? Other Relevant Orders  ? CBC with Differential/Platelet  ? Comprehensive metabolic panel  ? Thyroid Panel With TSH  ? ? ? ? ?Meds ordered this encounter  ?Medications  ? escitalopram (LEXAPRO) 10 MG tablet  ?  Sig: Take 1 tablet (10 mg total) by mouth daily.  ?  Dispense:  30 tablet  ?  Refill:  2  ? ? ?I, Donato SchultzYvonne R Lowne Chase, DO, personally preformed the services described in this documentation.  All medical record entries made by the scribe were at my direction and in my presence.  I have reviewed the chart and discharge instructions (if applicable) and agree that the record reflects my personal performance and is accurate and complete. 09/12/2021 ? ? ?I,Amber Collins,acting as a Neurosurgeonscribe for [employer]Yvonne R Lowne Chase, DO.,have documented all relevant documentation on the behalf of Donato SchultzYvonne R Lowne Chase, DO,as directed by  Donato SchultzYvonne R Lowne Chase, DO while in the presence of Donato SchultzYvonne R Lowne Chase, DO. ? ? ? ?Donato SchultzYvonne R Lowne Chase, DO ? ?

## 2021-09-13 LAB — THYROID PANEL WITH TSH
Free Thyroxine Index: 2.9 (ref 1.4–3.8)
T3 Uptake: 36 % — ABNORMAL HIGH (ref 22–35)
T4, Total: 8 ug/dL (ref 4.9–10.5)
TSH: 1.37 mIU/L (ref 0.40–4.50)

## 2021-10-11 ENCOUNTER — Telehealth (INDEPENDENT_AMBULATORY_CARE_PROVIDER_SITE_OTHER): Payer: 59 | Admitting: Family Medicine

## 2021-10-11 ENCOUNTER — Encounter: Payer: Self-pay | Admitting: Family Medicine

## 2021-10-11 DIAGNOSIS — F418 Other specified anxiety disorders: Secondary | ICD-10-CM | POA: Diagnosis not present

## 2021-10-11 MED ORDER — ESCITALOPRAM OXALATE 10 MG PO TABS: 10.0000 mg | ORAL_TABLET | Freq: Every day | ORAL | 3 refills | Status: AC

## 2021-10-11 MED ORDER — ESCITALOPRAM OXALATE 10 MG PO TABS: 10.0000 mg | ORAL_TABLET | Freq: Every day | ORAL | 3 refills | Status: DC

## 2021-10-11 NOTE — Progress Notes (Addendum)
? ? ?MyChart Video Visit ? ? ? ?Virtual Visit via Video Note  ? ?This visit type was conducted due to national recommendations for restrictions regarding the COVID-19 Pandemic (e.g. social distancing) in an effort to limit this patient's exposure and mitigate transmission in our community. This patient is at least at moderate risk for complications without adequate follow up. This format is felt to be most appropriate for this patient at this time. Physical exam was limited by quality of the video and audio technology used for the visit. Luster LandsbergHeather Stevenson was able to get the patient set up on a video visit. ? ?Patient location: Home Patient and provider in visit ?Provider location: Office ? ?I discussed the limitations of evaluation and management by telemedicine and the availability of in person appointments. The patient expressed understanding and agreed to proceed. ? ?Visit Date: 10/11/2021 ? ?Today's healthcare provider: Donato SchultzYvonne R Lowne Chase, DO  ? ? ? ?Subjective:  ? ? Patient ID: Charles Zhang, male    DOB: 03/21/1996, 26 y.o.   MRN: [id number] ? ?Chief Complaint  ?Patient presents with  ? Depression  ? Anxiety  ? Follow-up  ? ? ?Depression ?       Associated symptoms include does not have insomnia, no headaches and no suicidal ideas.  Past medical history includes anxiety.   ?Anxiety ?Symptoms include nervous/anxious behavior. Patient reports no chest pain, dizziness, insomnia, nausea, palpitations, shortness of breath or suicidal ideas.  ? ? ?Patient is in today for a follow up video visit.  ? ?His anxiety and depression has improved since his last visit. His depression has not improved as much has his anxiety. He continues having episodes of anxiety. He was given 10 mg lexapro daily PO and reports sleeping longer while taking it, otherwise he has no new issues while taking it. He reports no change in his energy levels throughout the day while taking it. He would like to take the medication longer so he can  determine if he needs to increase the dosage.   ? ? ?Past Medical History:  ?Diagnosis Date  ? Asthma   ? ? ?No past surgical history on file. ? ?Family History  ?Problem Relation Age of Onset  ? Cancer Maternal Aunt [id number]  ?     colon  ? Cancer Maternal Uncle 875  ?     colon  ? Cancer Maternal Grandmother   ?     breast  ? Cancer Paternal Grandmother   ?     uterine  ? ? ?Social History  ? ?Socioeconomic History  ? Marital status: Single  ?  Spouse name: Not on file  ? Number of children: Not on file  ? Years of education: Not on file  ? Highest education level: Not on file  ?Occupational History  ? Occupation: student  ?Tobacco Use  ? Smoking status: Never  ? Smokeless tobacco: Never  ?Substance and Sexual Activity  ? Alcohol use: No  ? Drug use: No  ? Sexual activity: Not on file  ?Other Topics Concern  ? Not on file  ?Social History Narrative  ? Not on file  ? ?Social Determinants of Health  ? ?Financial Resource Strain: Not on file  ?Food Insecurity: Not on file  ?Transportation Needs: Not on file  ?Physical Activity: Not on file  ?Stress: Not on file  ?Social Connections: Not on file  ?Intimate Partner Violence: Not on file  ? ? ?Outpatient Medications Prior to Visit  ?Medication Sig Dispense  Refill  ? escitalopram (LEXAPRO) 10 MG tablet Take 1 tablet (10 mg total) by mouth daily. 30 tablet 2  ? ?No facility-administered medications prior to visit.  ? ? ?No Known Allergies ? ?Review of Systems  ?Constitutional:  Negative for fever and malaise/fatigue.  ?HENT:  Negative for congestion.   ?Eyes:  Negative for blurred vision.  ?Respiratory:  Negative for shortness of breath.   ?Cardiovascular:  Negative for chest pain, palpitations and leg swelling.  ?Gastrointestinal:  Negative for abdominal pain, blood in stool and nausea.  ?Genitourinary:  Negative for dysuria and frequency.  ?Musculoskeletal:  Negative for falls.  ?Skin:  Negative for rash.  ?Neurological:  Negative for dizziness, loss of consciousness and  headaches.  ?Endo/Heme/Allergies:  Negative for environmental allergies.  ?Psychiatric/Behavioral:  Positive for depression. Negative for hallucinations, memory loss, substance abuse and suicidal ideas. The patient is nervous/anxious. The patient does not have insomnia.   ? ?   ?Objective:  ?  ?Physical Exam ?Vitals and nursing note reviewed.  ?Psychiatric:     ?   Attention and Perception: Attention normal.     ?   Mood and Affect: Mood and affect normal.     ?   Speech: Speech normal.     ?   Behavior: Behavior normal. Behavior is cooperative.     ?   Thought Content: Thought content normal. Thought content is not paranoid. Thought content does not include homicidal or suicidal ideation. Thought content does not include homicidal or suicidal plan.     ?   Judgment: Judgment normal.  ? ? ?There were no vitals taken for this visit. ?Wt Readings from Last 3 Encounters:  ?09/12/21 204 lb 6.4 oz (92.7 kg)  ?02/14/20 235 lb (106.6 kg)  ?12/07/17 220 lb (99.8 kg)  ? ? ?Diabetic Foot Exam - Simple   ?No data filed ?  ? ?Lab Results  ?Component Value Date  ? WBC 7.6 09/12/2021  ? HGB 14.5 09/12/2021  ? HCT 42.0 09/12/2021  ? PLT 304.0 09/12/2021  ? GLUCOSE 93 09/12/2021  ? ALT 19 09/12/2021  ? AST 26 09/12/2021  ? NA 140 09/12/2021  ? K 4.2 09/12/2021  ? CL 104 09/12/2021  ? CREATININE 0.92 09/12/2021  ? BUN 15 09/12/2021  ? CO2 27 09/12/2021  ? TSH 1.37 09/12/2021  ? ? ?Lab Results  ?Component Value Date  ? TSH 1.37 09/12/2021  ? ?Lab Results  ?Component Value Date  ? WBC 7.6 09/12/2021  ? HGB 14.5 09/12/2021  ? HCT 42.0 09/12/2021  ? MCV 90.4 09/12/2021  ? PLT 304.0 09/12/2021  ? ?Lab Results  ?Component Value Date  ? NA 140 09/12/2021  ? K 4.2 09/12/2021  ? CO2 27 09/12/2021  ? GLUCOSE 93 09/12/2021  ? BUN 15 09/12/2021  ? CREATININE 0.92 09/12/2021  ? BILITOT 1.0 09/12/2021  ? ALKPHOS 59 09/12/2021  ? AST 26 09/12/2021  ? ALT 19 09/12/2021  ? PROT 7.3 09/12/2021  ? ALBUMIN 4.8 09/12/2021  ? CALCIUM 9.4 09/12/2021  ?  ANIONGAP 9 12/07/2017  ? GFR 116.40 09/12/2021  ? ?No results found for: CHOL ?No results found for: HDL ?No results found for: LDLCALC ?No results found for: TRIG ?No results found for: CHOLHDL ?No results found for: HGBA1C ? ?   ?Assessment & Plan:  ? ?Problem List Items Addressed This Visit   ? ?  ? Unprioritized  ? Depression with anxiety  ? Relevant Medications  ? escitalopram (LEXAPRO) 10 MG tablet  ? ? ? ?  Meds ordered this encounter  ?Medications  ? DISCONTD: escitalopram (LEXAPRO) 10 MG tablet  ?  Sig: Take 1 tablet (10 mg total) by mouth daily.  ?  Dispense:  90 tablet  ?  Refill:  3  ? escitalopram (LEXAPRO) 10 MG tablet  ?  Sig: Take 1 tablet (10 mg total) by mouth daily.  ?  Dispense:  90 tablet  ?  Refill:  3  ? ? ?I discussed the assessment and treatment plan with the patient. The patient was provided an opportunity to ask questions and all were answered. The patient agreed with the plan and demonstrated an understanding of the instructions. ?  ?The patient was advised to call back or seek an in-person evaluation if the symptoms worsen or if the condition fails to improve as anticipated. ? ?Engineer, structural as a Neurosurgeon for [employer], DO.,have documented all relevant documentation on the behalf of Donato Schultz, DO,as directed by  Donato Schultz, DO while in the presence of Donato Schultz, DO. ? ?I provided 20 minutes of face-to-face time during this encounter. ? ? ?Donato Schultz, DO ?Holiday representative at [employer] ?[phone] (phone) ?[phone] (fax) ? ?[facility]  ?

## 2021-10-11 NOTE — Patient Instructions (Signed)

## 2023-05-18 ENCOUNTER — Encounter: Payer: 59 | Admitting: Family Medicine
